# Patient Record
Sex: Female | Born: 1970 | Race: White | Hispanic: No
Health system: Southern US, Community
[De-identification: ages and names within clinical notes are randomized; demographics above are authoritative.]

---

## 2014-12-21 ENCOUNTER — Emergency Department: Payer: Self-pay | Admitting: Emergency Medicine

## 2014-12-22 ENCOUNTER — Telehealth: Payer: Self-pay

## 2014-12-22 NOTE — Telephone Encounter (Signed)
Pt was seen in ED for elevated BP and needs an appt. Please call.

## 2014-12-25 NOTE — Telephone Encounter (Signed)
Pt was in ED 12/21/14 for cough & congestion, found to have BP of 228/113, given HCTZ 25 mg.  She would like an appt to f/u on this as soon as possible. You don't have any new pt openings this week, so the front desk routed this to see if you can open a spot.

## 2014-12-26 NOTE — Telephone Encounter (Signed)
While we wait for appointment, would buy a blood pressure cuff and closely monitor blood pressure at home Some medication adjustment can be done over the phone Can also possibly be elevated in the acute setting of viral infection

## 2014-12-27 NOTE — Telephone Encounter (Signed)
Left message for pt w/ Dr. Windell HummingbirdGollan's recommendation and asked her to call back to schedule the next available new pt appt.

## 2014-12-28 DIAGNOSIS — I10 Essential (primary) hypertension: Secondary | ICD-10-CM | POA: Insufficient documentation

## 2015-01-03 DIAGNOSIS — I351 Nonrheumatic aortic (valve) insufficiency: Secondary | ICD-10-CM | POA: Insufficient documentation

## 2018-03-20 ENCOUNTER — Other Ambulatory Visit: Payer: Self-pay

## 2018-03-20 ENCOUNTER — Emergency Department: Payer: BLUE CROSS/BLUE SHIELD

## 2018-03-20 ENCOUNTER — Emergency Department
Admission: EM | Admit: 2018-03-20 | Discharge: 2018-03-20 | Disposition: A | Discharge status: Home / Self Care | Payer: BLUE CROSS/BLUE SHIELD | Attending: Student in an Organized Health Care Education/Training Program | Admitting: Student in an Organized Health Care Education/Training Program

## 2018-03-20 DIAGNOSIS — I10 Essential (primary) hypertension: Secondary | ICD-10-CM | POA: Insufficient documentation

## 2018-03-20 DIAGNOSIS — Z7722 Contact with and (suspected) exposure to environmental tobacco smoke (acute) (chronic): Secondary | ICD-10-CM | POA: Diagnosis not present

## 2018-03-20 DIAGNOSIS — R202 Paresthesia of skin: Secondary | ICD-10-CM | POA: Insufficient documentation

## 2018-03-20 DIAGNOSIS — R4789 Other speech disturbances: Secondary | ICD-10-CM | POA: Diagnosis not present

## 2018-03-20 LAB — COMPREHENSIVE METABOLIC PANEL
ALK PHOS: 72 U/L (ref 38–126)
ALT: 14 U/L (ref 14–54)
ANION GAP: 7 (ref 5–15)
AST: 19 U/L (ref 15–41)
Albumin: 4.1 g/dL (ref 3.5–5.0)
BUN: 13 mg/dL (ref 6–20)
CALCIUM: 8.9 mg/dL (ref 8.9–10.3)
CO2: 23 mmol/L (ref 22–32)
CREATININE: 0.82 mg/dL (ref 0.44–1.00)
Chloride: 107 mmol/L (ref 101–111)
Glucose, Bld: 107 mg/dL — ABNORMAL HIGH (ref 65–99)
Potassium: 3.6 mmol/L (ref 3.5–5.1)
SODIUM: 137 mmol/L (ref 135–145)
TOTAL PROTEIN: 7.2 g/dL (ref 6.5–8.1)
Total Bilirubin: 0.6 mg/dL (ref 0.3–1.2)

## 2018-03-20 LAB — CBC
HEMATOCRIT: 41 % (ref 35.0–47.0)
Hemoglobin: 14.1 g/dL (ref 12.0–16.0)
MCH: 30.4 pg (ref 26.0–34.0)
MCHC: 34.3 g/dL (ref 32.0–36.0)
MCV: 88.5 fL (ref 80.0–100.0)
PLATELETS: 172 10*3/uL (ref 150–440)
RBC: 4.64 MIL/uL (ref 3.80–5.20)
RDW: 14.2 % (ref 11.5–14.5)
WBC: 7.5 10*3/uL (ref 3.6–11.0)

## 2018-03-20 LAB — GLUCOSE, CAPILLARY: GLUCOSE-CAPILLARY: 89 mg/dL (ref 65–99)

## 2018-03-20 LAB — DIFFERENTIAL
Basophils Absolute: 0 10*3/uL (ref 0–0.1)
Basophils Relative: 1 %
Eosinophils Absolute: 0.2 10*3/uL (ref 0–0.7)
Eosinophils Relative: 2 %
LYMPHS PCT: 22 %
Lymphs Abs: 1.6 10*3/uL (ref 1.0–3.6)
MONO ABS: 0.4 10*3/uL (ref 0.2–0.9)
MONOS PCT: 6 %
NEUTROS ABS: 5.3 10*3/uL (ref 1.4–6.5)
Neutrophils Relative %: 69 %

## 2018-03-20 LAB — TROPONIN I
TROPONIN I: 0.03 ng/mL — AB (ref <0.03)
Troponin I: 0.03 ng/mL (ref <0.03)

## 2018-03-20 LAB — APTT: aPTT: 30 seconds (ref 24–36)

## 2018-03-20 LAB — PREGNANCY, URINE: PREG TEST UR: NEGATIVE

## 2018-03-20 LAB — PROTIME-INR
INR: 0.89
PROTHROMBIN TIME: 12 s (ref 11.4–15.2)

## 2018-03-20 MED ORDER — AMLODIPINE BESYLATE 5 MG PO TABS
10.0000 mg | ORAL_TABLET | Freq: Once | ORAL | Status: AC
  Administered 2018-03-20: 10 mg via ORAL
  Filled 2018-03-20: qty 2

## 2018-03-20 MED ORDER — ASPIRIN 81 MG PO CHEW
324.0000 mg | CHEWABLE_TABLET | Freq: Once | ORAL | Status: AC
  Administered 2018-03-20: 324 mg via ORAL
  Filled 2018-03-20: qty 4

## 2018-03-20 MED ORDER — AMLODIPINE BESYLATE 5 MG PO TABS: 5.0000 mg | ORAL_TABLET | Freq: Every day | ORAL | 11 refills | Status: DC

## 2018-03-20 NOTE — ED Notes (Signed)
RN Darl Pikes in room with patient at this time. MD Roxan Hockey at bedside.

## 2018-03-20 NOTE — ED Provider Notes (Signed)
[facility] Emergency Department Provider Note    First MD Initiated Contact with Patient 03/20/18 1219     (approximate)  I have reviewed the triage vital signs and the nursing notes.   HISTORY  Chief Complaint Numbness    HPI Shelby Gibson is a 47 y.o. female presents to the ER with chief complaint of left arm and left face tingling sensation.  States that she has had this several times in the past particular the left side.  Always thought it was a pinched nerve.  States that today it was more severe and lasted longer.  Denies any history of stroke.  Denies any slurred speech but family states that she did have abnormal speech.  Symptoms improving.  Denies any headaches but has had headaches in the past.  Does have a history of high blood pressure.  No history of A. fib.  No history of high cholesterol.  History reviewed. No pertinent past medical history. No family history on file. History reviewed. No pertinent surgical history. There are no active problems to display for this patient.     Prior to Admission medications   Medication Sig Start Date End Date Taking? Authorizing Provider  amLODipine (NORVASC) 5 MG tablet Take 1 tablet (5 mg total) by mouth daily. 03/20/18 03/20/19  Willy Eddy, MD    Allergies Patient has no known allergies.    Social History Social History   Tobacco Use  . Smoking status: Not on file  Substance Use Topics  . Alcohol use: Not on file  . Drug use: Not on file    Review of Systems Patient denies headaches, rhinorrhea, blurry vision, numbness, shortness of breath, chest pain, edema, cough, abdominal pain, nausea, vomiting, diarrhea, dysuria, fevers, rashes or hallucinations unless otherwise stated above in HPI. ____________________________________________   PHYSICAL EXAM:  VITAL SIGNS: Vitals:   03/20/18 1300 03/20/18 1337  BP: (!) 196/101 (!) 206/103  Pulse: 96 (!) 104  Resp: 20 18  Temp:      SpO2: 98% 97%    Constitutional: Alert and oriented. Well appearing and in no acute distress. Eyes: Conjunctivae are normal.  Head: Atraumatic. Nose: No congestion/rhinnorhea. Mouth/Throat: Mucous membranes are moist.   Neck: No stridor. Painless ROM.  Cardiovascular: Normal rate, regular rhythm. Grossly normal heart sounds.  Good peripheral circulation. Respiratory: Normal respiratory effort.  No retractions. Lungs CTAB. Gastrointestinal: Soft and nontender. No distention. No abdominal bruits. No CVA tenderness. Genitourinary:  Musculoskeletal: No lower extremity tenderness nor edema.  No joint effusions. Neurologic:  CN- intact.  No facial droop, Normal FNF.  Normal heel to shin.  Sensation intact bilaterally. Normal speech and language. No gross focal neurologic deficits are appreciated. No gait instability.  Skin:  Skin is warm, dry and intact. No rash noted. Psychiatric: Mood and affect are normal. Speech and behavior are normal.  ____________________________________________   LABS (all labs ordered are listed, but only abnormal results are displayed)  Results for orders placed or performed during the hospital encounter of 03/20/18 (from the past 24 hour(s))  Protime-INR     Status: None   Collection Time: 03/20/18 12:05 PM  Result Value Ref Range   Prothrombin Time 12.0 11.4 - 15.2 seconds   INR 0.89   APTT     Status: None   Collection Time: 03/20/18 12:05 PM  Result Value Ref Range   aPTT 30 24 - 36 seconds  CBC     Status: None   Collection Time: 03/20/18 12:05  PM  Result Value Ref Range   WBC 7.5 3.6 - 11.0 K/uL   RBC 4.64 3.80 - 5.20 MIL/uL   Hemoglobin 14.1 12.0 - 16.0 g/dL   HCT 16.1 09.6 - 04.5 %   MCV 88.5 80.0 - 100.0 fL   MCH 30.4 26.0 - 34.0 pg   MCHC 34.3 32.0 - 36.0 g/dL   RDW 40.9 81.1 - 91.4 %   Platelets 172 150 - 440 K/uL  Differential     Status: None   Collection Time: 03/20/18 12:05 PM  Result Value Ref Range   Neutrophils Relative % 69 %    Neutro Abs 5.3 1.4 - 6.5 K/uL   Lymphocytes Relative 22 %   Lymphs Abs 1.6 1.0 - 3.6 K/uL   Monocytes Relative 6 %   Monocytes Absolute 0.4 0.2 - 0.9 K/uL   Eosinophils Relative 2 %   Eosinophils Absolute 0.2 0 - 0.7 K/uL   Basophils Relative 1 %   Basophils Absolute 0.0 0 - 0.1 K/uL  Comprehensive metabolic panel     Status: Abnormal   Collection Time: 03/20/18 12:05 PM  Result Value Ref Range   Sodium 137 135 - 145 mmol/L   Potassium 3.6 3.5 - 5.1 mmol/L   Chloride 107 101 - 111 mmol/L   CO2 23 22 - 32 mmol/L   Glucose, Bld 107 (H) 65 - 99 mg/dL   BUN 13 6 - 20 mg/dL   Creatinine, Ser 7.82 0.44 - 1.00 mg/dL   Calcium 8.9 8.9 - 95.6 mg/dL   Total Protein 7.2 6.5 - 8.1 g/dL   Albumin 4.1 3.5 - 5.0 g/dL   AST 19 15 - 41 U/L   ALT 14 14 - 54 U/L   Alkaline Phosphatase 72 38 - 126 U/L   Total Bilirubin 0.6 0.3 - 1.2 mg/dL   GFR calc non Af Amer >60 >60 mL/min   GFR calc Af Amer >60 >60 mL/min   Anion gap 7 5 - 15  Troponin I     Status: Abnormal   Collection Time: 03/20/18 12:05 PM  Result Value Ref Range   Troponin I 0.03 (HH) <0.03 ng/mL  Glucose, capillary     Status: None   Collection Time: 03/20/18 12:27 PM  Result Value Ref Range   Glucose-Capillary 89 65 - 99 mg/dL  Pregnancy, urine     Status: None   Collection Time: 03/20/18 12:29 PM  Result Value Ref Range   Preg Test, Ur NEGATIVE NEGATIVE  Troponin I     Status: Abnormal   Collection Time: 03/20/18  2:55 PM  Result Value Ref Range   Troponin I 0.03 (HH) <0.03 ng/mL   ____________________________________________  EKG My review and personal interpretation at Time: 12:31   Indication: tingling  Rate: 100  Rhythm: sinus Axis: normal Other: no stemi, nonspecific st abn ____________________________________________  RADIOLOGY  I personally reviewed all radiographic images ordered to evaluate for the above acute complaints and reviewed radiology reports and findings.  These findings were personally discussed  with the patient.  Please see medical record for radiology report.  ____________________________________________   PROCEDURES  Procedure(s) performed:  Procedures    Critical Care performed: no ____________________________________________   INITIAL IMPRESSION / ASSESSMENT AND PLAN / ED COURSE  Pertinent labs & imaging results that were available during my care of the patient were reviewed by me and considered in my medical decision making (see chart for details).  DDX: tia, cva, migraine, htn, press, paresthesia, radiculopathy  Shelby Gibson is a 47 y.o. who presents to the ED with his as described above.  Symptoms are improving and therefore not consistent with CVA or meeting criteria for TPA.  Also no objective weakness on neuro exam.  CT imaging shows no evidence of hemorrhage or mass.  Blood work will be sent for the above differential.  EKG shows no evidence of acute ischemia.  Clinical Course as of Mar 20 1558  Sat Mar 20, 2018  1324 Patient reassessed.  Tingling seems to is resolved.  Will order MRI to further characterize as the patient has been off of her blood pressure medications for over a year with smoke exposure.  Will also order MRI C-spine to evaluate for critical nerve impingement or anterior cord syndrome.   [PR]  1524 Repeat troponin stable.  MRI reassuring.  Does show some chronic changes but nothing to explain today's features.  At this point do believe patient is stable and appropriate for outpatient follow-up.  Will give Norvasc to evaluate for improvement in blood pressure.  Will have follow-up with neurology as well as PCP.   [PR]    Clinical Course User Index [PR] Willy Eddy, MD     As part of my medical decision making, I reviewed the following data within the electronic MEDICAL RECORD NUMBER Nursing notes reviewed and incorporated, Labs reviewed, notes from prior ED visits and [place] Controlled Substance  Database   ____________________________________________   FINAL CLINICAL IMPRESSION(S) / ED DIAGNOSES  Final diagnoses:  Hypertension, unspecified type  Paresthesias      NEW MEDICATIONS STARTED DURING THIS VISIT:  New Prescriptions   AMLODIPINE (NORVASC) 5 MG TABLET    Take 1 tablet (5 mg total) by mouth daily.     Note:  This document was prepared using Dragon voice recognition software and may include unintentional dictation errors.    Willy Eddy, MD 03/20/18 1600

## 2018-03-20 NOTE — ED Triage Notes (Signed)
Pt comes via POV from home with c/o left arm and face numbness. Pt states it feels like pins in her arm. Pt states she was sitting at home  Talking when the numbness came on. Pt denies blurred vision. Daughter states pt had some slurred speech earlier about 40 minutes ago. Pt talking in complete sentences w/o slurred speech. Pt denies SHOB, Chest pain, N/V. Pt is alert and oriented X4. Neuro exam performed at this time (Negative for any deficients noted).

## 2018-03-20 NOTE — ED Notes (Signed)
Pt speaking to mri 

## 2018-03-20 NOTE — ED Notes (Signed)
Pt to mri 

## 2019-08-02 IMAGING — MR MR CERVICAL SPINE W/O CM
14 of 16 series · 37 of 48 positions shown · non-contrast
Comparison: None.

CLINICAL DATA: Left face and arm numbness.

EXAM:
MRI CERVICAL SPINE WITHOUT CONTRAST
TECHNIQUE: Multiplanar, multisequence MR imaging of the cervical spine was
performed. No intravenous contrast was administered.

[Series 2: T1 · sagittal · 5.0mm · 0.45mm/px · 1 of 23 slices shown (1 of 3)]
[im 1/23]
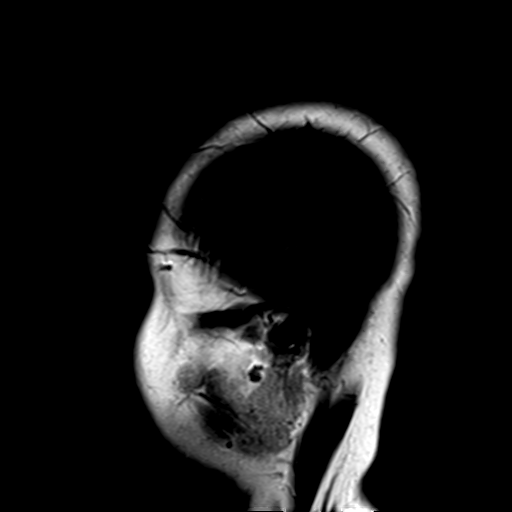

[Series 4: DWI · axial · 3.0mm · 1.80mm/px · z∈[-66,+95]mm · 2 of 54 slices shown (1 of 2)]
[im 1/54]
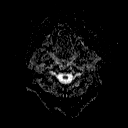
[im 54/54]
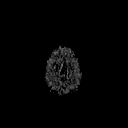

[Series 6: DWI · coronal · 3.0mm · 1.80mm/px · 3 of 45 slices shown (2 of 2)]
[im 1/45]
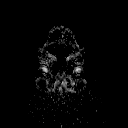
[im 23/45]
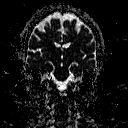
[im 45/45]
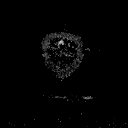

[Series 7: TOF · axial · non-contrast · 0.7mm · 0.37mm/px · z∈[-49,+50]mm · 8 of 143 slices shown]
[im 1/143]
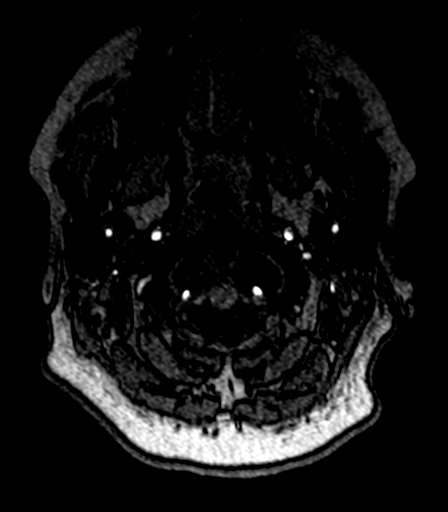
[im 18/143]
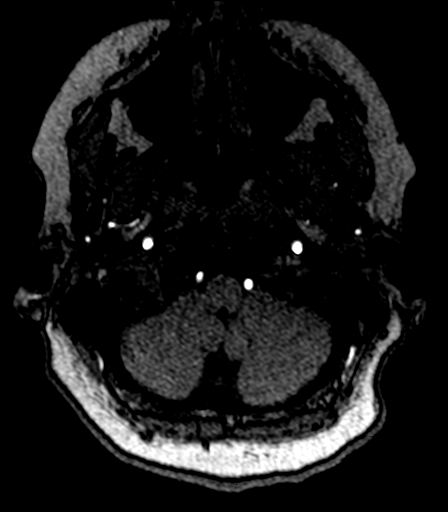
[im 36/143]
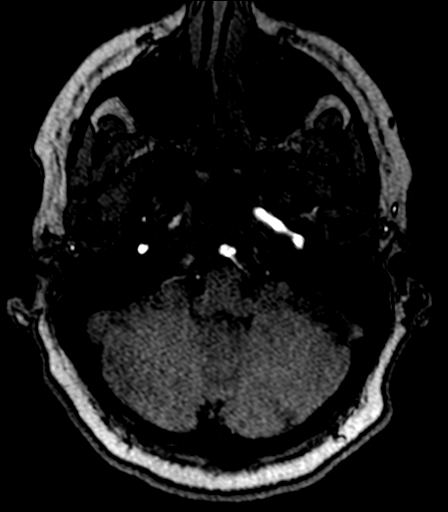
[im 54/143]
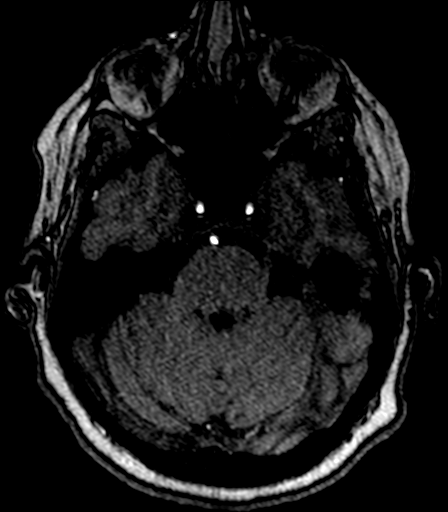
[im 89/143]
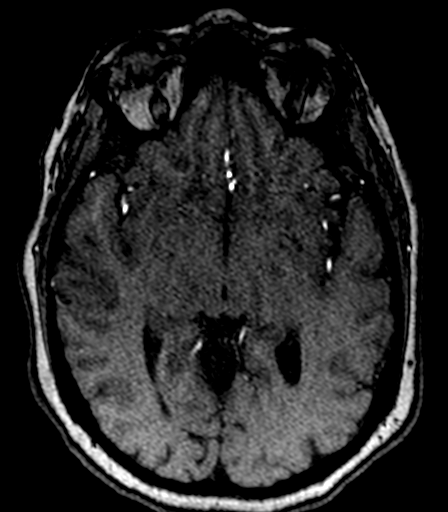
[im 107/143]
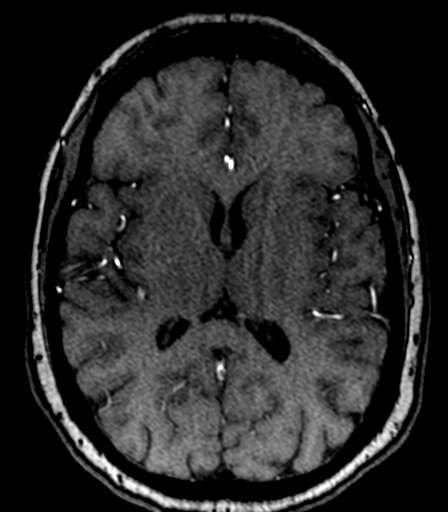
[im 125/143]
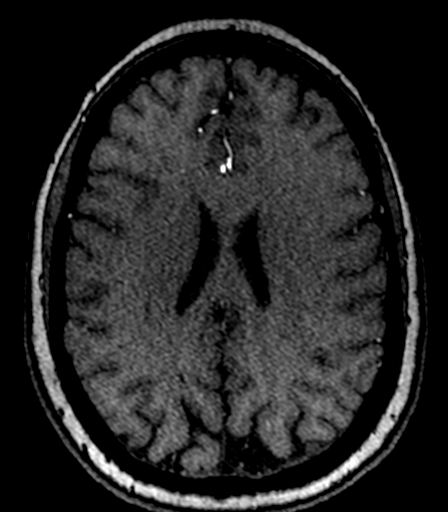
[im 143/143]
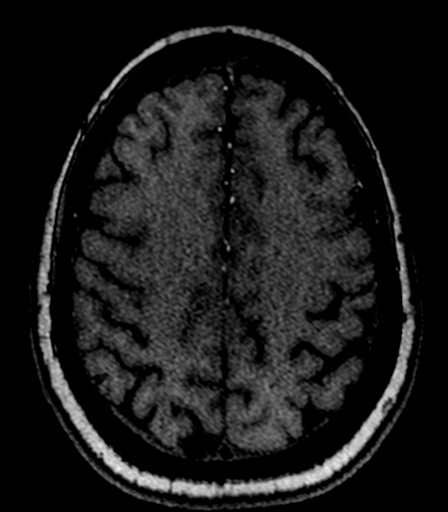

[Series 11: T2 · axial · 5.0mm · 0.60mm/px · z∈[-67,+95]mm · 2 of 26 slices shown (1 of 5)]
[im 1/26]
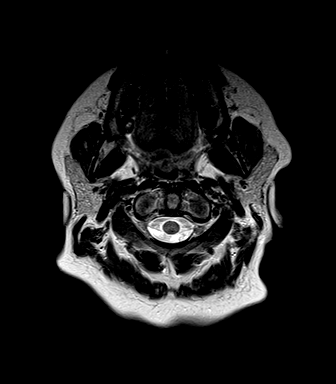
[im 26/26]
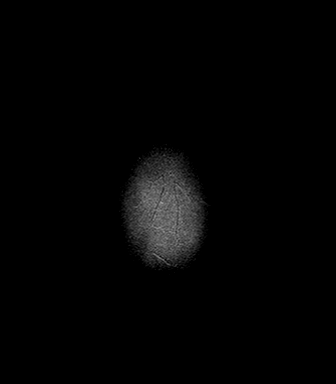

[Series 13: FLAIR · axial · 3.0mm · 0.45mm/px · z∈[-63,+95]mm · 3 of 54 slices shown]
[im 1/54]
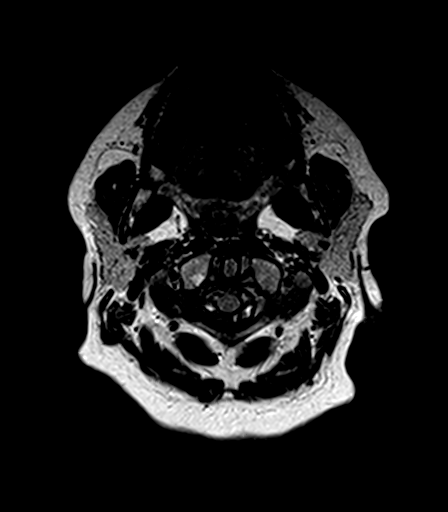
[im 27/54]
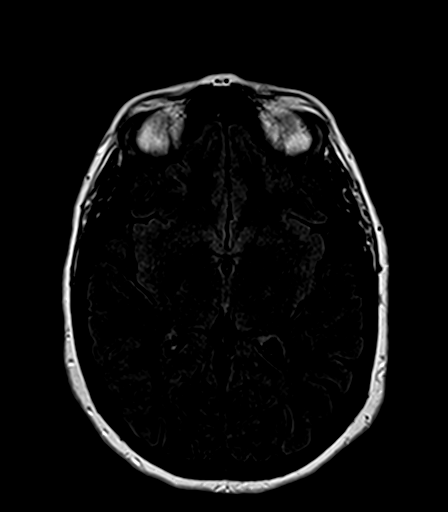
[im 54/54]
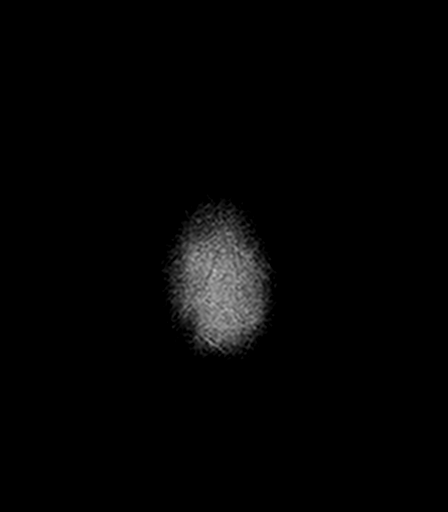

[Series 14: T2 · axial · 5.0mm · 0.45mm/px · z∈[-67,+95]mm · 2 of 26 slices shown (2 of 5)]
[im 1/26]
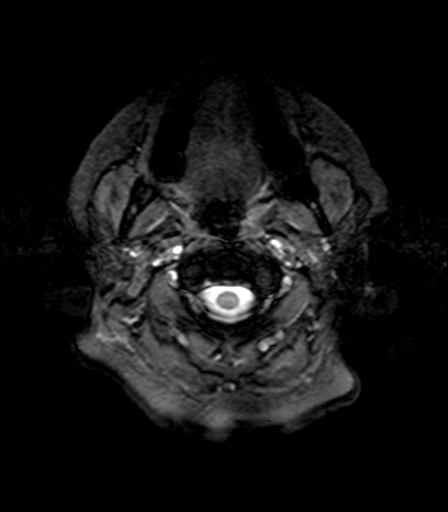
[im 26/26]
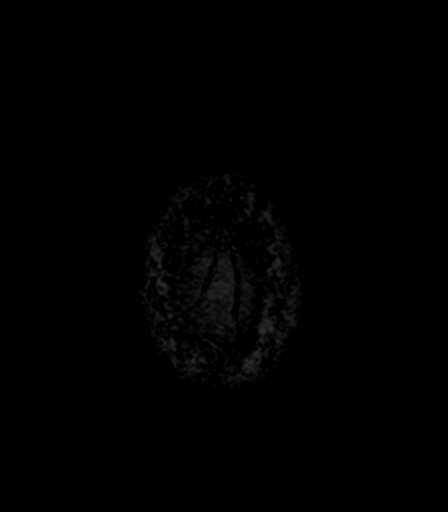

[Series 15: T1 · axial · 1.0mm · 1.00mm/px · z∈[-75,+99]mm · 8 of 176 slices shown (2 of 3)]
[im 1/176]
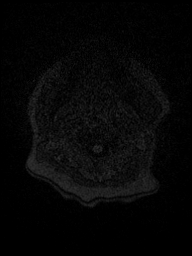
[im 36/176]
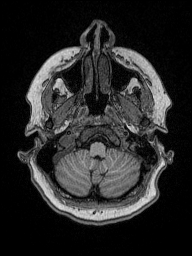
[im 53/176]
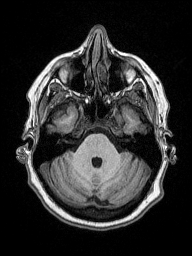
[im 71/176]
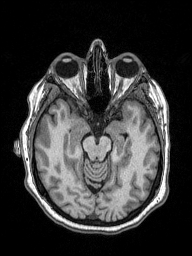
[im 106/176]
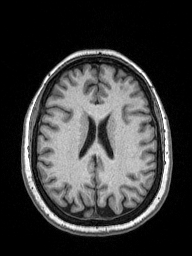
[im 123/176]
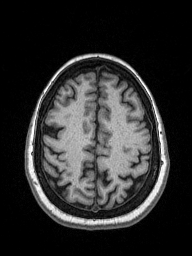
[im 141/176]
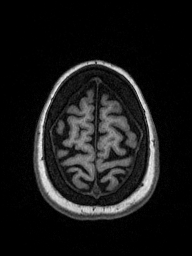
[im 176/176]
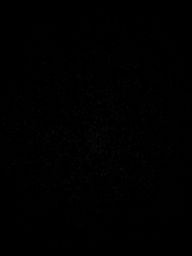

[Series 16: T2 · coronal · 5.0mm · 0.49mm/px · 2 of 27 slices shown (3 of 5)]
[im 1/27]
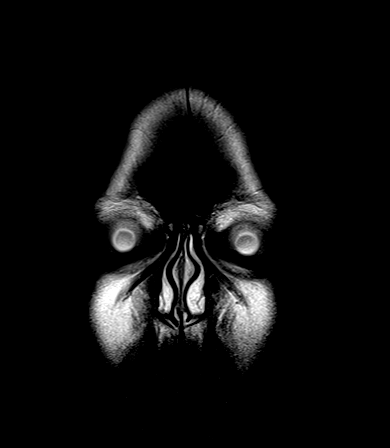
[im 27/27]
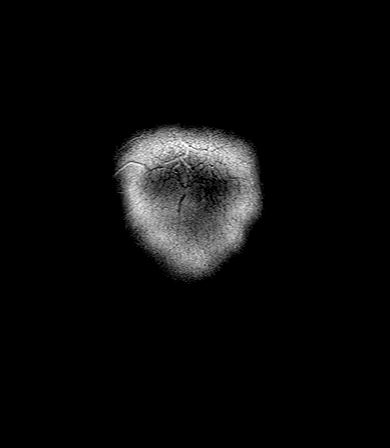

[Series 19: T2 · sagittal · 3.0mm · 0.82mm/px · 1 of 15 slices shown (4 of 5)]
[im 1/15]
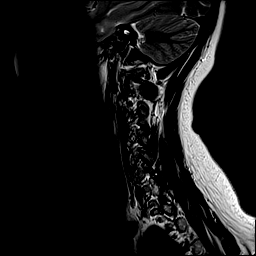

[Series 20: STIR · sagittal · 3.0mm · 0.41mm/px · 1 of 15 slices shown]
[im 1/15]
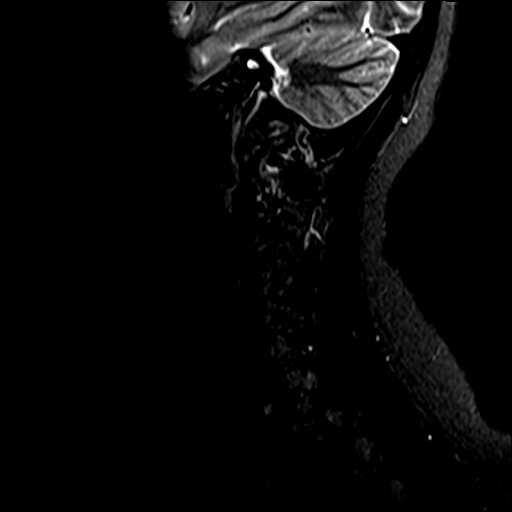

[Series 21: T1 · sagittal · 3.0mm · 0.82mm/px · 1 of 15 slices shown (3 of 3)]
[im 1/15]
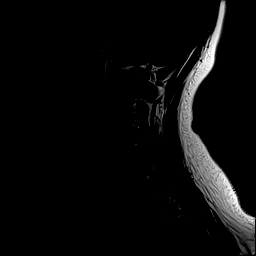

[Series 22: T2 · axial · 3.0mm · 0.70mm/px · z∈[-186,-89]mm · 2 of 27 slices shown (5 of 5)]
[im 1/27]
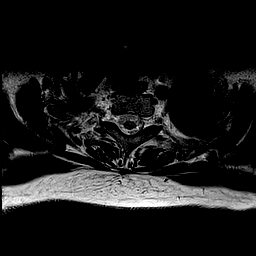
[im 27/27]
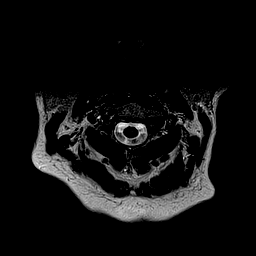

[Series 23: mpgr ax · axial · 3.0mm · 0.35mm/px · 1 of 27 slices shown]
[im 1/27]
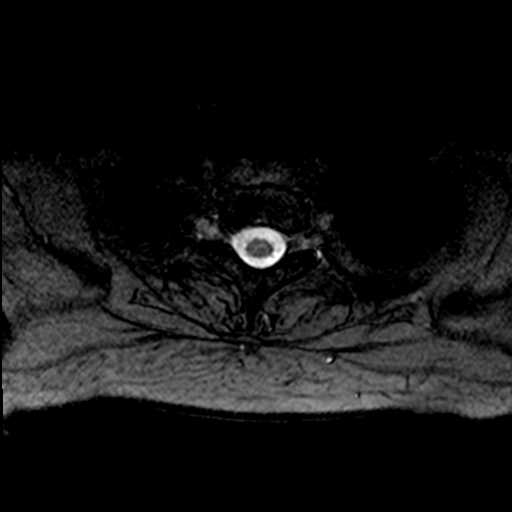

[37 of 48 positions shown; findings below may reference images not displayed]

FINDINGS: Alignment: Normal

Vertebrae: Normal

Cord: Normal. No evidence of demyelinating disease or other focal
lesion.

Posterior Fossa, vertebral arteries, paraspinal tissues: Normal

Disc levels:

All disc levels are normal with exception of mild uncovertebral
hypertrophy on the right at C5-6 without significant canal or
foraminal narrowing. No facet arthropathy.
IMPRESSION: Essentially normal MRI of the cervical spine. No cord abnormality.
Minimal uncovertebral hypertrophy on the right at C5-6 but without
neural compression.

## 2019-09-26 ENCOUNTER — Other Ambulatory Visit: Payer: Self-pay | Admitting: Family Medicine

## 2019-09-26 DIAGNOSIS — Z1231 Encounter for screening mammogram for malignant neoplasm of breast: Secondary | ICD-10-CM

## 2021-03-26 ENCOUNTER — Other Ambulatory Visit: Payer: Self-pay | Admitting: Family Medicine

## 2021-03-26 DIAGNOSIS — Z1231 Encounter for screening mammogram for malignant neoplasm of breast: Secondary | ICD-10-CM

## 2024-03-22 ENCOUNTER — Encounter: Payer: Self-pay | Admitting: Student in an Organized Health Care Education/Training Program

## 2024-03-22 ENCOUNTER — Ambulatory Visit: Admitting: Student in an Organized Health Care Education/Training Program

## 2024-03-22 VITALS — BP 140/94 | HR 85 | Ht 62.0 in | Wt 194.0 lb

## 2024-03-22 DIAGNOSIS — I10 Essential (primary) hypertension: Secondary | ICD-10-CM | POA: Diagnosis not present

## 2024-03-22 DIAGNOSIS — Z1159 Encounter for screening for other viral diseases: Secondary | ICD-10-CM

## 2024-03-22 DIAGNOSIS — I351 Nonrheumatic aortic (valve) insufficiency: Secondary | ICD-10-CM

## 2024-03-22 DIAGNOSIS — Z114 Encounter for screening for human immunodeficiency virus [HIV]: Secondary | ICD-10-CM

## 2024-03-22 DIAGNOSIS — Z131 Encounter for screening for diabetes mellitus: Secondary | ICD-10-CM | POA: Diagnosis not present

## 2024-03-22 DIAGNOSIS — Z13220 Encounter for screening for lipoid disorders: Secondary | ICD-10-CM | POA: Diagnosis not present

## 2024-03-22 LAB — HEMOGLOBIN A1C: Hgb A1c MFr Bld: 5.3 % (ref 4.6–6.5)

## 2024-03-22 MED ORDER — LOSARTAN POTASSIUM-HCTZ 50-12.5 MG PO TABS: 1.0000 | ORAL_TABLET | Freq: Every day | ORAL | 1 refills | Status: DC

## 2024-03-22 NOTE — Assessment & Plan Note (Signed)
 Chronic and currently untreated.  Consistent with stage II hypertension, chronic and asymptomatic.  Systolic blood pressures at the dentist office more 190, so I think we are going to need to be agents to control her chronic hypertension.  This is in the context of at least moderate adrenal insufficiency which is also asymptomatic.  So I think an ARB would be the most helpful.  Will also use a thiazide diuretic.  We talked about the risks of these medications.  We talked about our goals for managing hypertension.  Follow-up with me in 1 month for BMP recheck.

## 2024-03-22 NOTE — Progress Notes (Signed)
 New Patient Office Visit  Subjective    Patient ID: Shelby Gibson, female    DOB: June 02, 1971  Age: 53 y.o. MRN: [id number]  CC:   Chief Complaint  Patient presents with   Establish Care    Needs to address BP due to High BP reading at dentist appointment.     HPI  Shelby Gibson presents to establish care  53 year old person here for management of hypertension.  It has been about 2 years since she last saw a physician.  Currently not using any medications.  She was previously using amlodipine  to manage hypertension.  She has not had any health concerns over the last few years, she came to the office today because she has significant hypertension while at a dentist office and could not proceed with tooth extraction until the blood pressure was treated safely.  She lives in the [place] area, lives by herself, but has great support from her family.  She has 5 grown children, 8 grandchildren, many of whom live in this area.  She was married, but her husband passed away from a sudden illness in March, and she has been going through the grieving process.  Reports that her mood is doing okay, she stays busy at work.  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  Denies any chest pain or shortness of breath with exertion.  No lower extremity edema.  She reports a history of a possible stroke, looks like she had some imaging done in 2019 which showed some microvascular changes in her cerebral circulation.  In 2016 she was noted to have at least moderate aortic regurgitation on echocardiogram at [facility].  Her father had a history of ischemic heart disease, but denies any history of valvular disease in her family.  She has had no surgeries, no recent hospitalizations.  Otherwise has been very functional over the last several years.    Outpatient Encounter Medications as of 03/22/2024   Medication Sig   losartan-hydrochlorothiazide (HYZAAR) 50-12.5 MG tablet Take 1 tablet by mouth daily.   [DISCONTINUED] amLODipine  (NORVASC ) 5 MG tablet Take 1 tablet (5 mg total) by mouth daily.   No facility-administered encounter medications on file as of 03/22/2024.         Objective    BP (!) 140/94   Pulse 85   Ht 5\' 2"  (1.575 m)   Wt 194 lb (88 kg)   SpO2 97%   BMI 35.48 kg/m   Physical Exam  Gen: Well-appearing woman Eyes: Normal Ears: Normal bilateral tympanic membranes Neck: Normal thyroid, no nodules or adenopathy, no JVD, exaggerated carotid upstroke Heart: Regular, 3 out of 6 early diastolic murmur best heard at the right upper sternal border Lungs: Unlabored, clear throughout, no crackles Ext: Warm, no edema, normal joints Neuro: Alert, conversational, full strength upper and lower extremities, normal gait, normal balance Psy appropriate mood and affect, not anxious or depressed appearingch:      Assessment & Plan:   Problem List Items Addressed This Visit       High   Essential hypertension - Primary (Chronic)   Chronic and currently untreated.  Consistent with stage II hypertension, chronic and asymptomatic.  Systolic blood pressures at the dentist office more 190, so I think we are going to need to be agents to control her chronic hypertension.  This is in the context of at least  moderate adrenal insufficiency which is also asymptomatic.  So I think an ARB would be the most helpful.  Will also use a thiazide diuretic.  We talked about the risks of these medications.  We talked about our goals for managing hypertension.  Follow-up with me in 1 month for BMP recheck.      Relevant Medications   losartan-hydrochlorothiazide (HYZAAR) 50-12.5 MG tablet   Other Relevant Orders   Basic metabolic panel with GFR     Medium    Moderate aortic insufficiency (Chronic)   I reviewed the echocardiogram in care everywhere from [facility] in 2016.  On exam she does have  a early diastolic murmur most consistent with adrenal insufficiency.  She has some exaggerated carotid upstroke, but diastolic blood pressure is okay here in the office.  No signs or symptoms of heart failure.  Will repeat echocardiogram to evaluate degree of aortic insufficiency and rule out other structural changes of the left ventricle.  Will avoid beta-blockers.  We are starting losartan for management of the hypertension which should help with afterload reduction.      Relevant Medications   losartan-hydrochlorothiazide (HYZAAR) 50-12.5 MG tablet   Other Relevant Orders   ECHOCARDIOGRAM COMPLETE   Other Visit Diagnoses       Encounter for HCV screening test for low risk patient       Relevant Orders   Hepatitis C antibody     Screening for HIV (human immunodeficiency virus)       Relevant Orders   HIV Antibody (routine testing w rflx)     Screening for diabetes mellitus       Relevant Orders   Hemoglobin A1c     Screening for hyperlipidemia       Relevant Orders   Lipid panel       Return in about 4 weeks (around 04/19/2024) for hypertension management.   Ether Hercules, MD

## 2024-03-22 NOTE — Assessment & Plan Note (Signed)
 I reviewed the echocardiogram in care everywhere from [facility] in 2016.  On exam she does have a early diastolic murmur most consistent with adrenal insufficiency.  She has some exaggerated carotid upstroke, but diastolic blood pressure is okay here in the office.  No signs or symptoms of heart failure.  Will repeat echocardiogram to evaluate degree of aortic insufficiency and rule out other structural changes of the left ventricle.  Will avoid beta-blockers.  We are starting losartan for management of the hypertension which should help with afterload reduction.

## 2024-03-23 LAB — BASIC METABOLIC PANEL WITH GFR
BUN: 13 mg/dL (ref 6–23)
CO2: 27 meq/L (ref 19–32)
Calcium: 9.5 mg/dL (ref 8.4–10.5)
Chloride: 104 meq/L (ref 96–112)
Creatinine, Ser: 0.74 mg/dL (ref 0.40–1.20)
GFR: 92.78 mL/min (ref >60.00)
Glucose, Bld: 74 mg/dL (ref 70–99)
Potassium: 4.7 meq/L (ref 3.5–5.1)
Sodium: 138 meq/L (ref 135–145)

## 2024-03-23 LAB — LIPID PANEL
Cholesterol: 146 mg/dL (ref 0–200)
HDL: 47.7 mg/dL (ref >39.00)
LDL Cholesterol: 72 mg/dL (ref 0–99)
NonHDL: 98.05
Total CHOL/HDL Ratio: 3
Triglycerides: 128 mg/dL (ref 0.0–149.0)
VLDL: 25.6 mg/dL (ref 0.0–40.0)

## 2024-03-23 LAB — HIV ANTIBODY (ROUTINE TESTING W REFLEX): HIV 1&2 Ab, 4th Generation: NONREACTIVE

## 2024-03-23 LAB — HEPATITIS C ANTIBODY: Hepatitis C Ab: NONREACTIVE

## 2024-03-24 ENCOUNTER — Ambulatory Visit: Payer: Self-pay | Admitting: Student in an Organized Health Care Education/Training Program

## 2024-03-24 DIAGNOSIS — I77810 Thoracic aortic ectasia: Secondary | ICD-10-CM

## 2024-03-25 ENCOUNTER — Ambulatory Visit (HOSPITAL_COMMUNITY)
Admission: RE | Admit: 2024-03-25 | Discharge: 2024-03-25 | Disposition: A | Discharge status: Home / Self Care | Source: Ambulatory Visit | Attending: Student in an Organized Health Care Education/Training Program | Admitting: Student in an Organized Health Care Education/Training Program

## 2024-03-25 DIAGNOSIS — I77810 Thoracic aortic ectasia: Secondary | ICD-10-CM

## 2024-03-25 DIAGNOSIS — I5030 Unspecified diastolic (congestive) heart failure: Secondary | ICD-10-CM | POA: Diagnosis not present

## 2024-03-25 DIAGNOSIS — I351 Nonrheumatic aortic (valve) insufficiency: Secondary | ICD-10-CM

## 2024-03-25 LAB — ECHOCARDIOGRAM COMPLETE
Area-P 1/2: 3.6 cm2
P 1/2 time: 317 ms
S' Lateral: 3.5 cm

## 2024-04-06 ENCOUNTER — Encounter: Payer: Self-pay | Admitting: Student in an Organized Health Care Education/Training Program

## 2024-04-07 ENCOUNTER — Ambulatory Visit
Admission: RE | Admit: 2024-04-07 | Discharge: 2024-04-07 | Disposition: A | Discharge status: Home / Self Care | Source: Ambulatory Visit | Attending: Student in an Organized Health Care Education/Training Program | Admitting: Student in an Organized Health Care Education/Training Program

## 2024-04-07 DIAGNOSIS — I7121 Aneurysm of the ascending aorta, without rupture: Secondary | ICD-10-CM | POA: Diagnosis not present

## 2024-04-07 DIAGNOSIS — I77810 Thoracic aortic ectasia: Secondary | ICD-10-CM

## 2024-04-07 MED ORDER — IOPAMIDOL (ISOVUE-370) INJECTION 76%
75.0000 mL | Freq: Once | INTRAVENOUS | Status: AC | PRN
  Administered 2024-04-07: 75 mL via INTRAVENOUS

## 2024-04-08 ENCOUNTER — Ambulatory Visit: Payer: Self-pay | Admitting: Student in an Organized Health Care Education/Training Program

## 2024-04-08 DIAGNOSIS — I77810 Thoracic aortic ectasia: Secondary | ICD-10-CM | POA: Insufficient documentation

## 2024-04-19 ENCOUNTER — Ambulatory Visit: Admitting: Student in an Organized Health Care Education/Training Program

## 2024-04-19 ENCOUNTER — Encounter: Payer: Self-pay | Admitting: Student in an Organized Health Care Education/Training Program

## 2024-04-19 VITALS — BP 147/72 | HR 75 | Wt 193.0 lb

## 2024-04-19 DIAGNOSIS — K047 Periapical abscess without sinus: Secondary | ICD-10-CM | POA: Insufficient documentation

## 2024-04-19 DIAGNOSIS — I10 Essential (primary) hypertension: Secondary | ICD-10-CM | POA: Diagnosis not present

## 2024-04-19 DIAGNOSIS — I351 Nonrheumatic aortic (valve) insufficiency: Secondary | ICD-10-CM | POA: Diagnosis not present

## 2024-04-19 MED ORDER — LOSARTAN POTASSIUM-HCTZ 100-25 MG PO TABS: 1.0000 | ORAL_TABLET | Freq: Every day | ORAL | 1 refills | Status: DC

## 2024-04-19 NOTE — Assessment & Plan Note (Addendum)
 Chronic and stable.  We reviewed the results of the echocardiogram.  LVEF is normal.  She has moderate aortic regurgitation likely due to a 42 mm dilation of the thoracic aorta.  This is asymptomatic, she has no dyspnea on exertion, no angina, no signs of heart failure on exam.  We talked about the natural progression of this, need for surveillance, possible need for surgical repair in the future.  Will plan for repeat echocardiogram in 1 year.  Working on better blood pressure control.  Will avoid beta-blockers.  I told her that if she would develop heart failure symptoms, reduced EF, severe AR on echo, or growth of the thoracic aortic aneurysm, I would refer her for surgical consultation.

## 2024-04-19 NOTE — Progress Notes (Signed)
   Established Patient Office Visit  Subjective   Patient ID: Shelby Gibson, female    DOB: 08/28/1971  Age: 53 y.o. MRN: [id number]  Chief Complaint  Patient presents with   Medical Management of Chronic Issues    4 week follow up for hypertension     HPI  53 year old person here for 4-week follow-up after starting medications for hypertension.  Doing pretty well, she has some swelling and discomfort in her left lower face due to a tooth infection.  Started antibiotics yesterday.  Otherwise has been functional.  Denies any dyspnea with exertion, no chest pain or pressure.  No orthopnea or PND.  She started the combination losartan /HCTZ about 4 weeks ago.  Reports good adherence once daily.  Denies any lightheadedness or other side effects.    Objective:     BP (!) 147/72   Pulse 75   Wt 193 lb (87.5 kg)   SpO2 100%   BMI 35.30 kg/m    Physical Exam  Gen: Well-appearing Mouth: Dental caries at teeth 18 and 19, there is purulence at the gumline next to tooth 19. Heart: 2 out of 6 early diastolic murmur best heard at the left lower sternal border Lungs: Unlabored, clear throughout Ext: Warm, no edema    Assessment & Plan:   Problem List Items Addressed This Visit       High   Essential hypertension - Primary (Chronic)   Chronic.  Blood pressure has some improvement today at 147/72.  Would like better control given the dilation of the thoracic aorta.  Will increase to losartan  100 mg and HCTZ 25 mg daily.  Check BMP today.  Follow-up in 4 weeks for blood pressure recheck.      Relevant Medications   losartan -hydrochlorothiazide (HYZAAR) 100-25 MG tablet   Other Relevant Orders   Basic metabolic panel with GFR     Medium    Moderate aortic insufficiency (Chronic)   Chronic and stable.  We reviewed the results of the echocardiogram.  LVEF is normal.  She has moderate aortic regurgitation likely due to a 42 mm dilation of the thoracic aorta.  This is asymptomatic, she  has no dyspnea on exertion, no angina, no signs of heart failure on exam.  We talked about the natural progression of this, need for surveillance, possible need for surgical repair in the future.  Will plan for repeat echocardiogram in 1 year.  Working on better blood pressure control.  Will avoid beta-blockers.  I told her that if she would develop heart failure symptoms, reduced EF, severe AR on echo, or growth of the thoracic aortic aneurysm, I would refer her for surgical consultation.      Relevant Medications   losartan -hydrochlorothiazide (HYZAAR) 100-25 MG tablet     Unprioritized   Tooth infection   Exam is consistent with an acute odontogenic infection at tooth #19.  Started on penicillin VK yesterday by her dentist, I agree with this.  No signs or symptoms of deep neck space infection or complicated infection today.  She is planning to have tooth #19 and 18 extracted in the near future once her blood pressure is under control.  We started antihypertensives and I think her blood pressure is adequate to go for those procedures when it can be scheduled.       Return in about 4 weeks (around 05/17/2024).    Ether Hercules, MD

## 2024-04-19 NOTE — Assessment & Plan Note (Signed)
 Exam is consistent with an acute odontogenic infection at tooth #19.  Started on penicillin VK yesterday by her dentist, I agree with this.  No signs or symptoms of deep neck space infection or complicated infection today.  She is planning to have tooth #19 and 18 extracted in the near future once her blood pressure is under control.  We started antihypertensives and I think her blood pressure is adequate to go for those procedures when it can be scheduled.

## 2024-04-19 NOTE — Assessment & Plan Note (Signed)
 Chronic.  Blood pressure has some improvement today at 147/72.  Would like better control given the dilation of the thoracic aorta.  Will increase to losartan  100 mg and HCTZ 25 mg daily.  Check BMP today.  Follow-up in 4 weeks for blood pressure recheck.

## 2024-04-20 ENCOUNTER — Telehealth: Payer: Self-pay

## 2024-04-20 ENCOUNTER — Encounter: Payer: Self-pay | Admitting: Student in an Organized Health Care Education/Training Program

## 2024-04-20 ENCOUNTER — Telehealth: Payer: Self-pay | Admitting: Student in an Organized Health Care Education/Training Program

## 2024-04-20 DIAGNOSIS — I10 Essential (primary) hypertension: Secondary | ICD-10-CM

## 2024-04-20 LAB — UNLABELED: Test Ordered On Req: 10165

## 2024-04-20 NOTE — Telephone Encounter (Signed)
Note faxed to fax number provided below.

## 2024-04-20 NOTE — Telephone Encounter (Signed)
 Paper work was sent from [facility] and needs signatures and sent back to its designated place. Paper work is given to the [place]. Please advise, Thanks

## 2024-04-20 NOTE — Telephone Encounter (Signed)
 Called [facility] back and they stated specimen did not have a patient identifier on it. [facility] will be faxing papers over to have completed stating this is the correct patient and will need to fax back over.

## 2024-04-20 NOTE — Telephone Encounter (Signed)
 Patient would like provider to write a clarence for a dental procedure.

## 2024-04-20 NOTE — Telephone Encounter (Signed)
 Copied from CRM [id number]. Topic: General - Other >> Apr 20, 2024  9:54 AM Baldo Levan wrote: Reason for CRM: Rissie from [facility] diagnostic called in stating they received a specimen and they can not use for the test that was ordered. Also,the specimen does not have a name on it.  Reference order number [id number] and a good call back phone number is [phone].

## 2024-04-20 NOTE — Telephone Encounter (Signed)
 [facility] sent a form for this current pt-- paper work is given to the [place].

## 2024-04-20 NOTE — Telephone Encounter (Signed)
Note completed. Thank you.

## 2024-04-20 NOTE — Telephone Encounter (Signed)
 Called patient to have her come in to repeat BMP, [facility] was not able to use the first sample placed yesterday. I did explain to the patient is was a mix up of tubes, patient verbalized understanding. Scheduled for Friday 04/22/2024

## 2024-04-20 NOTE — Telephone Encounter (Signed)
 Forms placed with provider to sign then will fax back

## 2024-04-20 NOTE — Telephone Encounter (Signed)
 Copied from CRM [id number]. Topic: General - Other >> Apr 20, 2024  8:44 AM Alyse July wrote: Reason for CRM: Patient would like a document written by the provider to have her clear to have a dental procedure. Appt wont be scheduled until doc is received. [facility]. Fax [phone].

## 2024-04-22 ENCOUNTER — Other Ambulatory Visit

## 2024-04-29 ENCOUNTER — Other Ambulatory Visit (INDEPENDENT_AMBULATORY_CARE_PROVIDER_SITE_OTHER)

## 2024-04-29 DIAGNOSIS — I10 Essential (primary) hypertension: Secondary | ICD-10-CM | POA: Diagnosis not present

## 2024-04-29 LAB — BASIC METABOLIC PANEL WITH GFR
BUN: 16 mg/dL (ref 6–23)
CO2: 28 meq/L (ref 19–32)
Calcium: 9.9 mg/dL (ref 8.4–10.5)
Chloride: 101 meq/L (ref 96–112)
Creatinine, Ser: 0.75 mg/dL (ref 0.40–1.20)
GFR: 91.23 mL/min (ref >60.00)
Glucose, Bld: 88 mg/dL (ref 70–99)
Potassium: 3.7 meq/L (ref 3.5–5.1)
Sodium: 137 meq/L (ref 135–145)

## 2024-04-29 NOTE — Addendum Note (Signed)
 Addended by: Violia Knopf K on: 04/29/2024 10:35 AM   Modules accepted: Orders

## 2024-05-02 ENCOUNTER — Ambulatory Visit: Payer: Self-pay | Admitting: Student in an Organized Health Care Education/Training Program

## 2024-05-19 ENCOUNTER — Ambulatory Visit: Admitting: Student in an Organized Health Care Education/Training Program

## 2024-05-19 ENCOUNTER — Encounter: Payer: Self-pay | Admitting: Student in an Organized Health Care Education/Training Program

## 2024-05-19 VITALS — BP 147/92 | HR 93 | Wt 190.0 lb

## 2024-05-19 DIAGNOSIS — Z1231 Encounter for screening mammogram for malignant neoplasm of breast: Secondary | ICD-10-CM

## 2024-05-19 DIAGNOSIS — I10 Essential (primary) hypertension: Secondary | ICD-10-CM | POA: Diagnosis not present

## 2024-05-19 MED ORDER — AMLODIPINE BESYLATE 5 MG PO TABS: 5.0000 mg | ORAL_TABLET | Freq: Every day | ORAL | 3 refills | Status: DC

## 2024-05-19 NOTE — Assessment & Plan Note (Signed)
 Blood pressure remains elevated at 147/92 mmHg despite maximum dose of losartan -hydrochlorothiazide. Additional medication required to achieve target systolic blood pressure below 869 mmHg to reduce risk of thoracic aortic dilation progression. Amlodipine  recommended as third first-line medication. - Prescribe amlodipine , one tablet once daily. - Monitor for side effects of amlodipine , including lightheadedness and lower extremity swelling. - Advise use of compression socks if lower extremity swelling occurs. - Order blood work to check potassium levels due to increased losartan  dose. - Schedule follow-up appointment in 4-6 weeks to reassess blood pressure and medication tolerance.

## 2024-05-19 NOTE — Progress Notes (Signed)
   Established Patient Office Visit  Subjective   Patient ID: Shelby Gibson, female    DOB: 1971/05/26  Age: 53 y.o. MRN: [id number]  Chief Complaint  Patient presents with   Medical Management of Chronic Issues    4 week follow up for hypertension     HPI  Discussed the use of AI scribe software for clinical note transcription with the patient, who gave verbal consent to proceed.  History of Present Illness Shelby Gibson is a 53 year old female with hypertension who presents for blood pressure management.  She is currently managing her hypertension with a losartan  HCTZ combination, taking two tablets once daily. She has not yet started a new prescription, which she picked up last month, possibly for a stronger tablet. She takes her medication consistently every morning, with occasional lapses.  She mentions a skin discoloration on her legs and feet, described as red when warm and purple when cold, which does not cause pain or discomfort. This discoloration has been present for an unspecified duration and is more concerning to her sisters than to her. She also notes that her palms used to turn bright red during her menstrual periods, but this has not been a concern since she no longer menstruates.  She expresses reluctance and lack of interest in consistently monitoring her blood pressure at home, despite having good intentions. No lightheadedness, chest pain, shortness of breath, or changes in color in her fingertips in the cold.      Objective:     BP (!) 147/92 (BP Location: Right Arm)   Pulse 93   Wt 190 lb (86.2 kg)   BMI 34.75 kg/m   Physical Exam  Gen: Well-appearing Heart: 2 out of 6 diastolic murmur at the left upper sternal border Lungs: Unlabored, clear throughout, no crackles Ext: Warm, no edema Skin: Blanching veil like mild erythema in the lower extremities, looks most consistent with livedo reticularis    Assessment & Plan:     Problem List Items  Addressed This Visit       High   Essential hypertension - Primary (Chronic)   Blood pressure remains elevated at 147/92 mmHg despite maximum dose of losartan -hydrochlorothiazide. Additional medication required to achieve target systolic blood pressure below 869 mmHg to reduce risk of thoracic aortic dilation progression. Amlodipine  recommended as third first-line medication. - Prescribe amlodipine , one tablet once daily. - Monitor for side effects of amlodipine , including lightheadedness and lower extremity swelling. - Advise use of compression socks if lower extremity swelling occurs. - Order blood work to check potassium levels due to increased losartan  dose. - Schedule follow-up appointment in 4-6 weeks to reassess blood pressure and medication tolerance.      Relevant Medications   amLODipine  (NORVASC ) 5 MG tablet   Other Relevant Orders   Basic metabolic panel with GFR   Other Visit Diagnoses       Screening mammogram for breast cancer       Relevant Orders   MM Digital Screening       Return in about 6 weeks (around 06/30/2024).    Cleatus Debby Specking, MD

## 2024-05-19 NOTE — Patient Instructions (Signed)
  VISIT SUMMARY: Today, we discussed your blood pressure management and addressed your concerns about skin discoloration on your legs and feet. We also reviewed your current medications and made some adjustments to better control your blood pressure.  YOUR PLAN: -ESSENTIAL HYPERTENSION: Essential hypertension is high blood pressure with no identifiable cause. Your blood pressure is still high at 147/92 mmHg, so we are adding a new medication called amlodipine  to help lower it. Please take one tablet of amlodipine  once daily. Watch for any side effects like lightheadedness or swelling in your legs. If you notice swelling, you can use compression socks. We will also check your potassium levels with a blood test because of the increased dose of losartan . Please schedule a follow-up appointment in 4-6 weeks to see how you are doing with the new medication.  -MODERATE AORTIC INSUFFICIENCY: Moderate aortic insufficiency is a condition where the aortic valve does not close properly, causing blood to flow backward into the heart. Managing your blood pressure is crucial to prevent this condition from worsening. We will plan for repeat imaging in one year to monitor your condition.  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 It is not harmful, but staying warm can help reduce the appearance of the skin changes.  INSTRUCTIONS: Please take the new medication, amlodipine , as prescribed and monitor for any side effects. Use compression socks if you experience swelling in your legs. Get your blood work done to check potassium levels. Schedule a follow-up appointment in 4-6 weeks to reassess your blood pressure and how you are tolerating the new medication. We will also plan for repeat imaging in one year to monitor your aortic insufficiency.

## 2024-05-20 ENCOUNTER — Ambulatory Visit: Payer: Self-pay | Admitting: Student in an Organized Health Care Education/Training Program

## 2024-05-20 LAB — BASIC METABOLIC PANEL WITH GFR
BUN: 20 mg/dL (ref 6–23)
CO2: 29 meq/L (ref 19–32)
Calcium: 10.9 mg/dL — ABNORMAL HIGH (ref 8.4–10.5)
Chloride: 102 meq/L (ref 96–112)
Creatinine, Ser: 0.82 mg/dL (ref 0.40–1.20)
GFR: 81.94 mL/min (ref >60.00)
Glucose, Bld: 89 mg/dL (ref 70–99)
Potassium: 4.4 meq/L (ref 3.5–5.1)
Sodium: 137 meq/L (ref 135–145)

## 2024-07-01 ENCOUNTER — Ambulatory Visit: Admitting: Student in an Organized Health Care Education/Training Program

## 2024-07-01 ENCOUNTER — Encounter: Payer: Self-pay | Admitting: Student in an Organized Health Care Education/Training Program

## 2024-07-01 VITALS — BP 125/92 | HR 105 | Wt 193.0 lb

## 2024-07-01 DIAGNOSIS — I10 Essential (primary) hypertension: Secondary | ICD-10-CM

## 2024-07-01 NOTE — Assessment & Plan Note (Signed)
 Blood pressure is controlled at 125/92 mmHg with Losartan  HCTZ. Amlodipine  was discontinued due to hypotension, diaphoresis, and emesis. She is asymptomatic with no chest pain, dyspnea, or orthostatic symptoms. The goal is to maintain systolic BP under 130 mmHg. Continue Losartan  HCTZ at full dose, one tablet daily. Follow up in 6 months for blood pressure recheck or sooner if hypotension symptoms or other concerns arise.

## 2024-07-01 NOTE — Assessment & Plan Note (Signed)
 Serum calcium is mildly elevated at 10.9 mg/dL, likely due to Losartan  HCTZ. She remains asymptomatic. Recheck serum calcium level in 6 to 12 months, or during the next follow-up visit.

## 2024-07-01 NOTE — Patient Instructions (Signed)
  VISIT SUMMARY: Today, we discussed your recent reaction to amlodipine , which you were taking for hypertension. You experienced significant side effects, including feeling very hot, sweating, and vomiting, which led to discontinuing the medication. Your blood pressure is now managed with Hyzaar, and your current reading is 125/92 mmHg. We also reviewed your mildly elevated serum calcium level.  YOUR PLAN: -ESSENTIAL HYPERTENSION: Hypertension means high blood pressure. Your blood pressure is currently controlled at 125/92 mmHg with Losartan  HCTZ. Since you had a reaction to amlodipine , you should continue taking Losartan  HCTZ at the full dose of one tablet daily. Our goal is to keep your systolic blood pressure under 869 mmHg. Please follow up in 6 months for a blood pressure recheck or sooner if you experience any symptoms of low blood pressure or other concerns.  -MILDLY ELEVATED SERUM CALCIUM (ASYMPTOMATIC): Your serum calcium level is slightly high at 10.9 mg/dL, which is likely due to your medication, Losartan  HCTZ. Since you do not have any symptoms, we will recheck your serum calcium level in 6 to 12 months or during your next follow-up visit.  INSTRUCTIONS: Please follow up in 6 months for a blood pressure recheck or sooner if you experience any symptoms of low blood pressure or other concerns. Additionally, we will recheck your serum calcium level in 6 to 12 months or during your next follow-up visit.

## 2024-07-01 NOTE — Progress Notes (Signed)
   Established Patient Office Visit  Subjective   Patient ID: Shelby Gibson, female    DOB: 11/24/1970  Age: 53 y.o. MRN: [id number]  Chief Complaint  Patient presents with   Medical Management of Chronic Issues    6 week follow up     HPI  Discussed the use of AI scribe software for clinical note transcription with the patient, who gave verbal consent to proceed.  History of Present Illness Shelby Gibson is a 53 year old female with hypertension who presents with a reaction to amlodipine .  She experienced an adverse reaction to amlodipine , which was initiated last month for hypertension management. After taking the medication, she felt unwell throughout the day, with symptoms intensifying to feeling very hot, sweating profusely, and vomiting by the afternoon. Her blood pressure was recorded at 80/unknown, which was unusually low for her. She discontinued amlodipine  immediately after this episode and has not experienced similar symptoms since.  Her hypertension is currently managed with Hyzaar, taken once daily. Previously, her blood pressure was 147/92 mmHg on the full dose of Hyzaar, leading to the addition of amlodipine . Currently, her blood pressure is 125/92 mmHg. She does not monitor her blood pressure at home but relies on occasional checks at a friend's boyfriend's house, who has a blood pressure cuff.  No chest pain, breathing issues during exertion, constipation, falls, or lightheadedness upon standing since discontinuing amlodipine . She confirms no further episodes of low blood pressure or related symptoms.     Objective:     BP (!) 125/92   Pulse (!) 105   Wt 193 lb (87.5 kg)   SpO2 100%   BMI 35.30 kg/m   Physical Exam  Gen: Well-appearing woman Heart: Regular, 2 out of 6 early systolic murmur best heard at the right upper sternal border Lungs: Unlabored, clear throughout Ext: Warm, no edema    Assessment & Plan:    Problem List Items Addressed This  Visit       High   Essential hypertension - Primary (Chronic)   Blood pressure is controlled at 125/92 mmHg with Losartan  HCTZ. Amlodipine  was discontinued due to hypotension, diaphoresis, and emesis. She is asymptomatic with no chest pain, dyspnea, or orthostatic symptoms. The goal is to maintain systolic BP under 130 mmHg. Continue Losartan  HCTZ at full dose, one tablet daily. Follow up in 6 months for blood pressure recheck or sooner if hypotension symptoms or other concerns arise.        Low   Hypercalcemia (Chronic)   Serum calcium is mildly elevated at 10.9 mg/dL, likely due to Losartan  HCTZ. She remains asymptomatic. Recheck serum calcium level in 6 to 12 months, or during the next follow-up visit.       Return in about 6 months (around 01/01/2025) for Wellness visit.    Cleatus Debby Specking, MD

## 2024-08-04 ENCOUNTER — Telehealth: Admitting: Physician Assistant

## 2024-08-04 DIAGNOSIS — B9689 Other specified bacterial agents as the cause of diseases classified elsewhere: Secondary | ICD-10-CM | POA: Diagnosis not present

## 2024-08-04 DIAGNOSIS — J019 Acute sinusitis, unspecified: Secondary | ICD-10-CM | POA: Diagnosis not present

## 2024-08-04 MED ORDER — AMOXICILLIN-POT CLAVULANATE 875-125 MG PO TABS: 1.0000 | ORAL_TABLET | Freq: Two times a day (BID) | ORAL | 0 refills | Status: AC

## 2024-08-04 NOTE — Patient Instructions (Signed)
 Shelby Gibson, thank you for joining [name], PA-C for today's virtual visit.  While this provider is not your primary care provider (PCP), if your PCP is located in our provider database this encounter information will be shared with them immediately following your visit.   A [name] MyChart account gives you access to today's visit and all your visits, tests, and labs performed at [facility]  click here if you don't have a [facility] MyChart account or go to [email or web address]  Consent: (Patient) Shelby Gibson provided verbal consent for this virtual visit at the beginning of the encounter.  Current Medications:  Current Outpatient Medications:    amoxicillin -clavulanate (AUGMENTIN ) 875-125 MG tablet, Take 1 tablet by mouth 2 (two) times daily., Disp: 14 tablet, Rfl: 0   losartan -hydrochlorothiazide (HYZAAR) 100-25 MG tablet, Take 1 tablet by mouth daily., Disp: 90 tablet, Rfl: 1   Medications ordered in this encounter:  Meds ordered this encounter  Medications   amoxicillin -clavulanate (AUGMENTIN ) 875-125 MG tablet    Sig: Take 1 tablet by mouth 2 (two) times daily.    Dispense:  14 tablet    Refill:  0    Supervising Provider:   BLAISE ALEENE KIDD [[id number]]     *If you need refills on other medications prior to your next appointment, please contact your pharmacy*  Follow-Up: Call back or seek an in-person evaluation if the symptoms worsen or if the condition fails to improve as anticipated.  [facility] [phone]  Other Instructions Sinus Infection, Adult A sinus infection, also called sinusitis, is inflammation of your sinuses. Sinuses are hollow spaces in the bones around your face. Your sinuses are located: Around your eyes. In the middle of your forehead. Behind your nose. In your cheekbones. Mucus normally drains out of your sinuses. When your nasal tissues become inflamed or swollen, mucus can become trapped  or blocked. This allows bacteria, viruses, and fungi to grow, which leads to infection. Most infections of the sinuses are caused by a virus. A sinus infection can develop quickly. It can last for up to 4 weeks (acute) or for more than 12 weeks (chronic). A sinus infection often develops after a cold. What are the causes? This condition is caused by anything that creates swelling in the sinuses or stops mucus from draining. This includes: Allergies. Asthma. Infection from bacteria or viruses. Deformities or blockages in your nose or sinuses. Abnormal growths in the nose (nasal polyps). Pollutants, such as chemicals or irritants in the air. Infection from fungi. This is rare. What increases the risk? You are more likely to develop this condition if you: Have a weak body defense system (immune system). Do a lot of swimming or diving. Overuse nasal sprays. Smoke. What are the signs or symptoms? The main symptoms of this condition are pain and a feeling of pressure around the affected sinuses. Other symptoms include: Stuffy nose or congestion that makes it difficult to breathe through your nose. Thick yellow or greenish drainage from your nose. Tenderness, swelling, and warmth over the affected sinuses. A cough that may get worse at night. Decreased sense of smell and taste. Extra mucus that collects in the throat or the back of the nose (postnasal drip) causing a sore throat or bad breath. Tiredness (fatigue). Fever. How is this diagnosed? This condition is diagnosed based on: Your symptoms. Your medical history. A physical exam. Tests to find out if your condition is acute or chronic. This may include: Checking your nose  for nasal polyps. Viewing your sinuses using a device that has a light (endoscope). Testing for allergies or bacteria. Imaging tests, such as an MRI or CT scan. In rare cases, a bone biopsy may be done to rule out more serious types of fungal sinus disease. How is  this treated? Treatment for a sinus infection depends on the cause and whether your condition is chronic or acute. If caused by a virus, your symptoms should go away on their own within 10 days. You may be given medicines to relieve symptoms. They include: Medicines that shrink swollen nasal passages (decongestants). A spray that eases inflammation of the nostrils (topical intranasal corticosteroids). Rinses that help get rid of thick mucus in your nose (nasal saline washes). Medicines that treat allergies (antihistamines). Over-the-counter pain relievers. If caused by bacteria, your health care provider may recommend waiting to see if your symptoms improve. Most bacterial infections will get better without antibiotic medicine. You may be given antibiotics if you have: A severe infection. A weak immune system. If caused by narrow nasal passages or nasal polyps, surgery may be needed. Follow these instructions at home: Medicines Take, use, or apply over-the-counter and prescription medicines only as told by your health care provider. These may include nasal sprays. If you were prescribed an antibiotic medicine, take it as told by your health care provider. Do not stop taking the antibiotic even if you start to feel better. Hydrate and humidify  Drink enough fluid to keep your urine pale yellow. Staying hydrated will help to thin your mucus. Use a cool mist humidifier to keep the humidity level in your home above 50%. Inhale steam for 10-15 minutes, 3-4 times a day, or as told by your health care provider. You can do this in the bathroom while a hot shower is running. Limit your exposure to cool or dry air. Rest Rest as much as possible. Sleep with your head raised (elevated). Make sure you get enough sleep each night. General instructions  Apply a warm, moist washcloth to your face 3-4 times a day or as told by your health care provider. This will help with discomfort. Use nasal saline  washes as often as told by your health care provider. Wash your hands often with soap and water to reduce your exposure to germs. If soap and water are not available, use hand sanitizer. Do not smoke. Avoid being around people who are smoking (secondhand smoke). Keep all follow-up visits. This is important. Contact a health care provider if: You have a fever. Your symptoms get worse. Your symptoms do not improve within 10 days. Get help right away if: You have a severe headache. You have persistent vomiting. You have severe pain or swelling around your face or eyes. You have vision problems. You develop confusion. Your neck is stiff. You have trouble breathing. These symptoms may be an emergency. Get help right away. Call 911. Do not wait to see if the symptoms will go away. Do not drive yourself to the hospital. Summary A sinus infection is soreness and inflammation of your sinuses. Sinuses are hollow spaces in the bones around your face. This condition is caused by nasal tissues that become inflamed or swollen. The swelling traps or blocks the flow of mucus. This allows bacteria, viruses, and fungi to grow, which leads to infection. If you were prescribed an antibiotic medicine, take it as told by your health care provider. Do not stop taking the antibiotic even if you start to feel better. Keep  all follow-up visits. This is important. This information is not intended to replace advice given to you by your health care provider. Make sure you discuss any questions you have with your health care provider. Document Revised: 10/01/2021 Document Reviewed: 10/01/2021 Elsevier Patient Education  2024 [employer]   If you have been instructed to have an in-person evaluation today at a local Urgent Care facility, please use the link below. It will take you to a list of all of our available [facility], including address, phone number and hours of operation. Please do not delay  care.  [facility]  If you or a family member do not have a primary care provider, use the link below to schedule a visit and establish care. When you choose a [facility] primary care physician or advanced practice provider, you gain a long-term partner in health. Find a Primary Care Provider  Learn more about [facility]'s in-office and virtual care options: [facility] - Get Care Now

## 2024-08-04 NOTE — Progress Notes (Signed)
 Virtual Visit Consent   Shelby Gibson, you are scheduled for a virtual visit with a [facility] provider today. Just as with appointments in the office, your consent must be obtained to participate. Your consent will be active for this visit and any virtual visit you may have with one of our providers in the next 365 days. If you have a MyChart account, a copy of this consent can be sent to you electronically.  As this is a virtual visit, video technology does not allow for your provider to perform a traditional examination. This may limit your provider's ability to fully assess your condition. If your provider identifies any concerns that need to be evaluated in person or the need to arrange testing (such as labs, EKG, etc.), we will make arrangements to do so. Although advances in technology are sophisticated, we cannot ensure that it will always work on either your end or our end. If the connection with a video visit is poor, the visit may have to be switched to a telephone visit. With either a video or telephone visit, we are not always able to ensure that we have a secure connection.  By engaging in this virtual visit, you consent to the provision of healthcare and authorize for your insurance to be billed (if applicable) for the services provided during this visit. Depending on your insurance coverage, you may receive a charge related to this service.  I need to obtain your verbal consent now. Are you willing to proceed with your visit today? Shelby Gibson has provided verbal consent on 08/04/2024 for a virtual visit (video or telephone). Shelby [name], PA-C  Date: 08/04/2024 12:21 PM   Virtual Visit via Video Note   IDelon CHRISTELLA Gibson, connected with  Shelby Gibson  ([id number], Feb 08, 1971) on 08/04/24 at 12:15 PM EDT by a video-enabled telemedicine application and verified that I am speaking with the correct person using two identifiers.  Location: Patient: Virtual Visit  Location Patient: Mobile Provider: Virtual Visit Location Provider: Home Office   I discussed the limitations of evaluation and management by telemedicine and the availability of in person appointments. The patient expressed understanding and agreed to proceed.    History of Present Illness: Shelby Gibson is a 53 y.o. who identifies as a female who was assigned female at birth, and is being seen today for sinus pain.  HPI: Sinusitis This is a new problem. The current episode started in the past 7 days. The problem has been gradually worsening since onset. Maximum temperature: possibly on Monday. The fever has been present for Less than 1 day. The pain is moderate. Associated symptoms include congestion, coughing (only from drainage and intermittent/rare), headaches, sinus pressure and a sore throat (initially, now improved). Pertinent negatives include no chills, ear pain, hoarse voice or shortness of breath. (Mild post nasal drainage) Past treatments include nasal decongestants (dayquil and nyquil, afrin last night). The treatment provided no relief.    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  Resting comfortably at home.  Head is normocephalic, atraumatic.  No labored breathing.  Speech is clear and coherent with logical content.  Patient is alert and oriented at baseline.    Assessment and Plan: 1. Acute  bacterial sinusitis (Primary) - amoxicillin -clavulanate (AUGMENTIN ) 875-125 MG tablet; Take 1 tablet by mouth 2 (two) times daily.  Dispense: 14 tablet; Refill: 0  - Worsening symptoms that have not responded to OTC medications.  - Will give Augmentin  - Continue allergy medications.  - Steam and humidifier can help - Stay well hydrated and get plenty of rest.  - Seek in person evaluation if no symptom improvement or if symptoms worsen   Follow Up Instructions: I discussed the assessment and treatment plan with the patient. The patient was provided an opportunity to ask questions and all were answered. The patient agreed with the plan and demonstrated an understanding of the instructions.  A copy of instructions were sent to the patient via MyChart unless otherwise noted below.    The patient was advised to call back or seek an in-person evaluation if the symptoms worsen or if the condition fails to improve as anticipated.    Shelby [name], PA-C

## 2024-10-18 ENCOUNTER — Other Ambulatory Visit: Payer: Self-pay | Admitting: Student in an Organized Health Care Education/Training Program

## 2024-10-18 DIAGNOSIS — I10 Essential (primary) hypertension: Secondary | ICD-10-CM

## 2025-01-06 ENCOUNTER — Encounter: Admitting: Student in an Organized Health Care Education/Training Program
